# Patient Record
Sex: Female | Born: 1950 | Race: White | Hispanic: No | Marital: Single
Health system: Southern US, Community
[De-identification: ages and names within clinical notes are randomized; demographics above are authoritative.]

---

## 2017-12-14 ENCOUNTER — Other Ambulatory Visit: Payer: Self-pay

## 2017-12-14 ENCOUNTER — Emergency Department (HOSPITAL_COMMUNITY): Payer: PRIVATE HEALTH INSURANCE

## 2017-12-14 ENCOUNTER — Encounter (HOSPITAL_COMMUNITY): Payer: Self-pay

## 2017-12-14 ENCOUNTER — Emergency Department (HOSPITAL_COMMUNITY)
Admission: EM | Admit: 2017-12-14 | Discharge: 2017-12-14 | Disposition: A | Discharge status: Home / Self Care | Payer: PRIVATE HEALTH INSURANCE | Attending: Emergency Medicine | Admitting: Emergency Medicine

## 2017-12-14 DIAGNOSIS — N201 Calculus of ureter: Secondary | ICD-10-CM

## 2017-12-14 DIAGNOSIS — R1031 Right lower quadrant pain: Secondary | ICD-10-CM | POA: Diagnosis present

## 2017-12-14 DIAGNOSIS — N130 Hydronephrosis with ureteropelvic junction obstruction: Secondary | ICD-10-CM | POA: Diagnosis not present

## 2017-12-14 DIAGNOSIS — K862 Cyst of pancreas: Secondary | ICD-10-CM

## 2017-12-14 DIAGNOSIS — N2889 Other specified disorders of kidney and ureter: Secondary | ICD-10-CM

## 2017-12-14 DIAGNOSIS — N132 Hydronephrosis with renal and ureteral calculous obstruction: Secondary | ICD-10-CM | POA: Diagnosis not present

## 2017-12-14 DIAGNOSIS — N202 Calculus of kidney with calculus of ureter: Secondary | ICD-10-CM | POA: Diagnosis not present

## 2017-12-14 DIAGNOSIS — N1330 Unspecified hydronephrosis: Secondary | ICD-10-CM

## 2017-12-14 LAB — URINALYSIS, ROUTINE W REFLEX MICROSCOPIC
Bilirubin Urine: NEGATIVE
Glucose, UA: NEGATIVE mg/dL
Ketones, ur: NEGATIVE mg/dL
Leukocytes, UA: NEGATIVE
Nitrite: NEGATIVE
PH: 8 (ref 5.0–8.0)
Protein, ur: NEGATIVE mg/dL
RBC / HPF: >50 RBC/hpf — ABNORMAL HIGH (ref 0–5)
SPECIFIC GRAVITY, URINE: 1.015 (ref 1.005–1.030)

## 2017-12-14 MED ORDER — TAMSULOSIN HCL 0.4 MG PO CAPS: ORAL_CAPSULE | ORAL | 0 refills | Status: AC

## 2017-12-14 MED ORDER — HYDROCODONE-ACETAMINOPHEN 5-325 MG PO TABS: 1.0000 | ORAL_TABLET | ORAL | 0 refills | Status: AC | PRN

## 2017-12-14 MED ORDER — HYDROCODONE-ACETAMINOPHEN 5-325 MG PO TABS
1.0000 | ORAL_TABLET | Freq: Once | ORAL | Status: AC
  Administered 2017-12-14: 1 via ORAL
  Filled 2017-12-14: qty 1

## 2017-12-14 MED ORDER — ONDANSETRON HCL 4 MG/2ML IJ SOLN
4.0000 mg | Freq: Once | INTRAMUSCULAR | Status: AC
  Administered 2017-12-14: 4 mg via INTRAVENOUS
  Filled 2017-12-14: qty 2

## 2017-12-14 NOTE — ED Notes (Signed)
Doctor at bedside.

## 2017-12-14 NOTE — ED Provider Notes (Signed)
[facility] EMERGENCY DEPARTMENT Provider Note   CSN: [id number] Arrival date & time: 12/14/17  1031     History   Chief Complaint Chief Complaint  Patient presents with  . Flank Pain    HPI Destiny Crawford is a 67 y.o. female.  HPI   Planes of right flank pain starting 3 hours ago, consistent with prior episodes of kidney stone.  No recent kidney stone.  She urinated this morning did not have dysuria, urinary frequency or hematuria.  She denies fever, chills, shortness of breath or chest pain.  She has nausea today.  There are no other no modifying factors.  History reviewed. No pertinent past medical history.  There are no active problems to display for this patient.   History reviewed. No pertinent surgical history.   OB History   None      Home Medications    Prior to Admission medications   Medication Sig Start Date End Date Taking? Authorizing Provider  HYDROcodone-acetaminophen (NORCO) 5-325 MG tablet Take 1 tablet by mouth every 4 (four) hours as needed for moderate pain. 12/14/17   Mancel Bale, MD  tamsulosin [facility]) 0.4 MG CAPS capsule 1 q HS to aid stone passage 12/14/17   Mancel Bale, MD    Family History No family history on file.  Social History Social History   Tobacco Use  . Smoking status: Not on file  Substance Use Topics  . Alcohol use: Not Currently  . Drug use: Never     Allergies   Patient has no allergy information on record.   Review of Systems Review of Systems  All other systems reviewed and are negative.    Physical Exam Updated Vital Signs BP (!) 146/73 (BP Location: Right Arm)   Pulse (!) 53   Temp 97.6 F (36.4 C) (Oral)   Resp 15   Ht 5\' 9"  (1.753 m)   Wt 72.6 kg   SpO2 100%   BMI 23.63 kg/m   Physical Exam  Constitutional: She is oriented to person, place, and time. She appears well-developed and well-nourished.  HENT:  Head: Normocephalic and atraumatic.  Eyes: Pupils are  equal, round, and reactive to light. Conjunctivae and EOM are normal.  Neck: Normal range of motion and phonation normal. Neck supple.  Cardiovascular: Normal rate and regular rhythm.  Pulmonary/Chest: Effort normal and breath sounds normal. She exhibits no tenderness.  Genitourinary:  Genitourinary Comments: No costovertebral angle tenderness with percussion.  Musculoskeletal: Normal range of motion.  Neurological: She is alert and oriented to person, place, and time. She exhibits normal muscle tone.  Skin: Skin is warm and dry.  Psychiatric: She has a normal mood and affect. Her behavior is normal. Judgment and thought content normal.  Nursing note and vitals reviewed.    ED Treatments / Results  Labs (all labs ordered are listed, but only abnormal results are displayed) Labs Reviewed  URINALYSIS, ROUTINE W REFLEX MICROSCOPIC - Abnormal; Notable for the following components:      Result Value   APPearance CLOUDY (*)    Hgb urine dipstick MODERATE (*)    RBC / HPF >50 (*)    Bacteria, UA RARE (*)    All other components within normal limits    EKG None  Radiology Ct Renal Stone Study  Result Date: 12/14/2017 CLINICAL DATA:  Right flank pain today EXAM: CT ABDOMEN AND PELVIS WITHOUT CONTRAST TECHNIQUE: Multidetector CT imaging of the abdomen and pelvis was performed following the standard protocol  without IV contrast. COMPARISON:  None. FINDINGS: Lower chest: No acute abnormality. Hepatobiliary: There are multiple cysts in the liver. Unremarkable gallbladder. Pancreas: There is a 1.5 cm oval low-density lesion in the head of the pancreas on image 29 of series 3. It is incompletely evaluated on this study. A cyst is favored, but a cystic neoplasm cannot be excluded. Spleen: Unremarkable Adrenals/Urinary Tract: Moderate right hydronephrosis. 2 mm calculus at the right ureteropelvic junction. Tiny calculi are scattered throughout the collecting system of the right kidney. No evidence  of left renal calculi. There is a 9 mm isodense lesion in the interpolar region of the left kidney on image 30. Right perinephric stranding is noted. Bladder is decompressed. Stomach/Bowel: No obvious mass in the colon. Appendix is within normal limits. No evidence of small-bowel obstruction. Stomach is decompressed. Small hiatal hernia is suspected. Vascular/Lymphatic: No abnormal retroperitoneal adenopathy. Multiple calcified phleboliths in the pelvis. No evidence of aortic aneurysm. Reproductive: Uterus and adnexa are within normal limits. Other: No free fluid. Musculoskeletal: No vertebral compression deformity. IMPRESSION: Moderate right hydronephrosis is associated with a 2 mm right ureteropelvic junction calculus. Right nephrolithiasis. Benign-appearing liver cysts. 1.5 cm cystic lesion in the head of the pancreas is nonspecific. Pancreas MRI is recommended. 9 mm indeterminate lesion in the left kidney as described. MRI can be performed to rule out enhancing mass. Electronically Signed   By: Jolaine ClickArthur  Hoss M.D.   On: 12/14/2017 11:14    Procedures Procedures (including critical care time)  Medications Ordered in ED Medications  HYDROcodone-acetaminophen (NORCO/VICODIN) 5-325 MG per tablet 1 tablet (1 tablet Oral Given 12/14/17 1051)  ondansetron (ZOFRAN) injection 4 mg (4 mg Intravenous Given 12/14/17 1051)     Initial Impression / Assessment and Plan / ED Course  I have reviewed the triage vital signs and the nursing notes.  Pertinent labs & imaging results that were available during my care of the patient were reviewed by me and considered in my medical decision making (see chart for details).  Clinical Course as of Dec 14 1225  Sat Dec 14, 2017  1206 Normal except increased blood and RBCs.  Rare bacteria bacteria are present.  Urinalysis, Routine w reflex microscopic(!) [EW]  1207 Normal except; obstructing proximal right ureter stone at UPJ.  Mild hydronephrosis is present.  Incidental  findings left renal and pancreatic cysts.  Images reviewed by me.  Patient was informed of these findings, by me.  CT Renal Soundra PilonStone Study [EW]    Clinical Course User Index [EW] Mancel BaleWentz, Patte Winkel, MD     Patient Vitals for the past 24 hrs:  BP Temp Temp src Pulse Resp SpO2 Height Weight  12/14/17 1105 (!) 146/73 97.6 F (36.4 C) Oral (!) 53 15 100 % - -  12/14/17 1038 - - - - - - 5\' 9"  (1.753 m) 72.6 kg    12:26 PM Reevaluation with update and discussion. After initial assessment and treatment, an updated evaluation reveals she is comfortable has no pain at this time.  Findings discussed and questions answered. Mancel BaleElliott Steffon Gladu   Medical Decision Making: Right flank pain secondary to obstructing kidney stone.  No evidence for UTI.  Pain controlled in the ED.  Incidental abnormalities pancreas and left kidney, small masses, requiring follow-up.  Patient informed.  Doubt serious bacterial infection, metabolic instability or impending vascular collapse.  CRITICAL CARE-no Performed by: Mancel BaleElliott Prestyn Stanco  Nursing Notes Reviewed/ Care Coordinated Applicable Imaging Reviewed Interpretation of Laboratory Data incorporated into ED treatment  The patient appears  reasonably screened and/or stabilized for discharge and I doubt any other medical condition or other [facility] requiring further screening, evaluation, or treatment in the ED at this time prior to discharge.  Plan: Home Medications-continue routine medications; Home Treatments-strain urine; return here if the recommended treatment, does not improve the symptoms; Recommended follow up-urology 1 week for follow-up.  PCP regarding pancreatic and left renal abnormalities follow-up in the next few weeks for MRI testing.    Final Clinical Impressions(s) / ED Diagnoses   Final diagnoses:  Obstruction of right ureteropelvic junction (UPJ) due to stone  Hydronephrosis, right  Pancreatic cyst  Renal mass    ED Discharge Orders         Ordered     HYDROcodone-acetaminophen (NORCO) 5-325 MG tablet  Every 4 hours PRN     12/14/17 1225    tamsulosin (FLOMAX) 0.4 MG CAPS capsule     12/14/17 1225           Mancel Bale, MD 12/14/17 1228

## 2017-12-14 NOTE — ED Triage Notes (Signed)
Pt c/o right sided flank pain that started 3 hours ago. Pt endorses n/v.

## 2017-12-14 NOTE — Discharge Instructions (Signed)
Your pain today is secondary to a small stone in the proximal right ureter causing some kidney swelling.  This can be treated with narcotic pain medicine and Flomax to help relax the tube and allow the stone to drop out.  This may take 2 or 3 days.  If your pain cannot be controlled[facility]  The CT also showed 2 cysts, one in the pancreas, and one in the left kidney.  We recommend that you get an MRI in the next few weeks, to evaluate both of these abnormalities.  Your primary care doctor should be able to do that for you.

## 2020-01-29 ENCOUNTER — Other Ambulatory Visit: Payer: Self-pay | Admitting: Family Medicine

## 2020-01-29 ENCOUNTER — Ambulatory Visit
Admission: RE | Admit: 2020-01-29 | Discharge: 2020-01-29 | Disposition: A | Discharge status: Home / Self Care | Payer: Medicare Other | Source: Ambulatory Visit | Attending: Family Medicine | Admitting: Family Medicine

## 2020-01-29 ENCOUNTER — Other Ambulatory Visit: Payer: Self-pay

## 2020-01-29 DIAGNOSIS — M130 Polyarthritis, unspecified: Secondary | ICD-10-CM

## 2021-10-21 IMAGING — CR DG LUMBAR SPINE COMPLETE 4+V
5 series · 5 of 5 positions shown · non-contrast
Comparison: None.

CLINICAL DATA: Hip pain

EXAM:
LUMBAR SPINE - COMPLETE 4+ VIEW

[t lumbar spine ap]
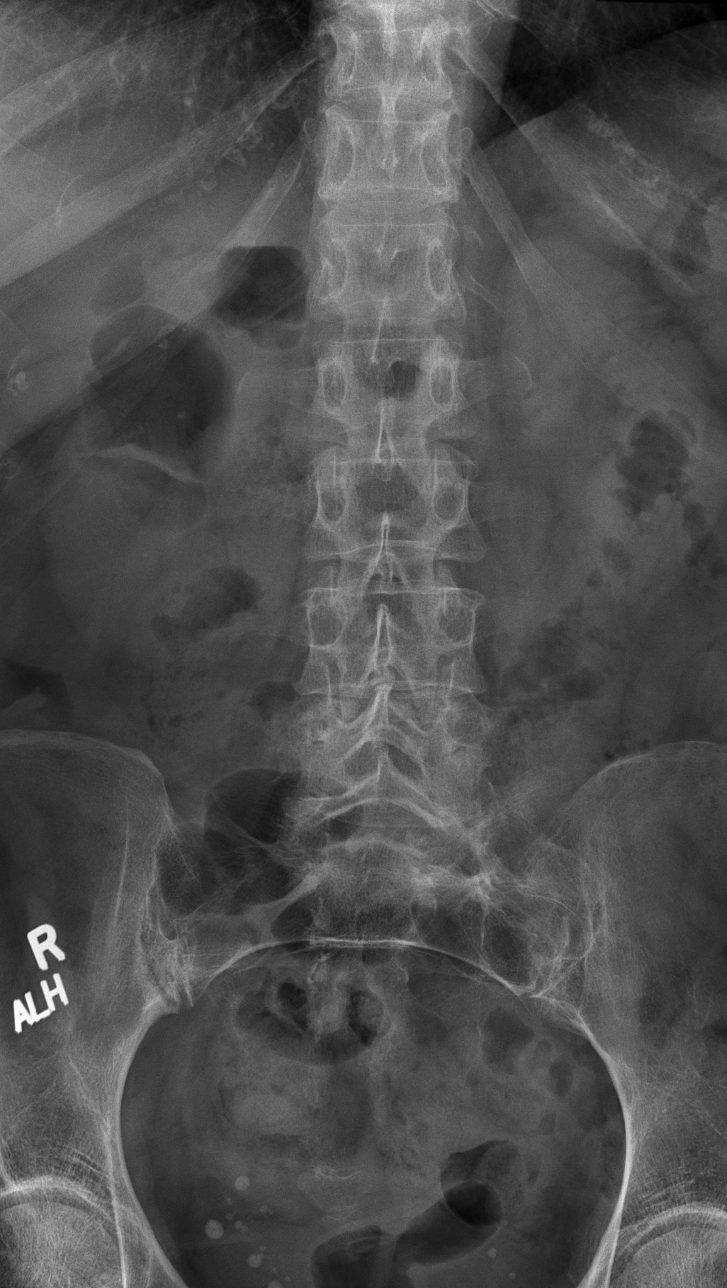

[t lumbar spine obl (1 of 2)]
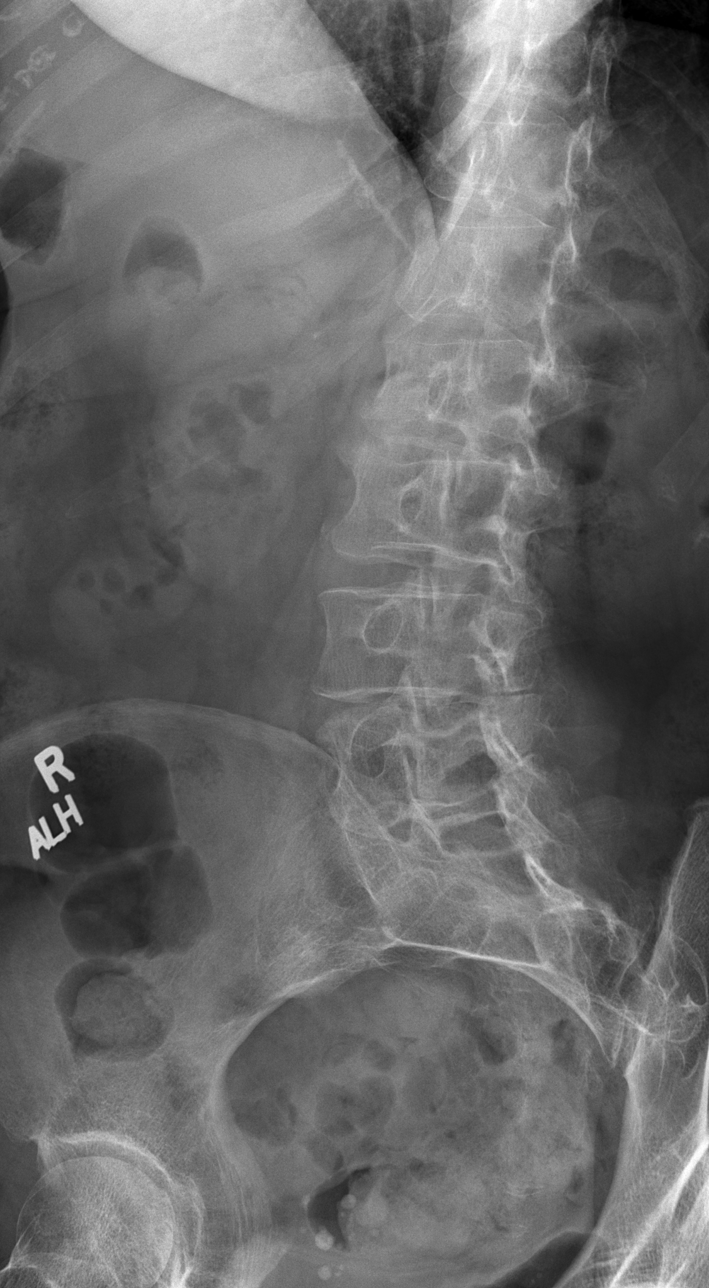

[t lumbar spine obl (2 of 2)]
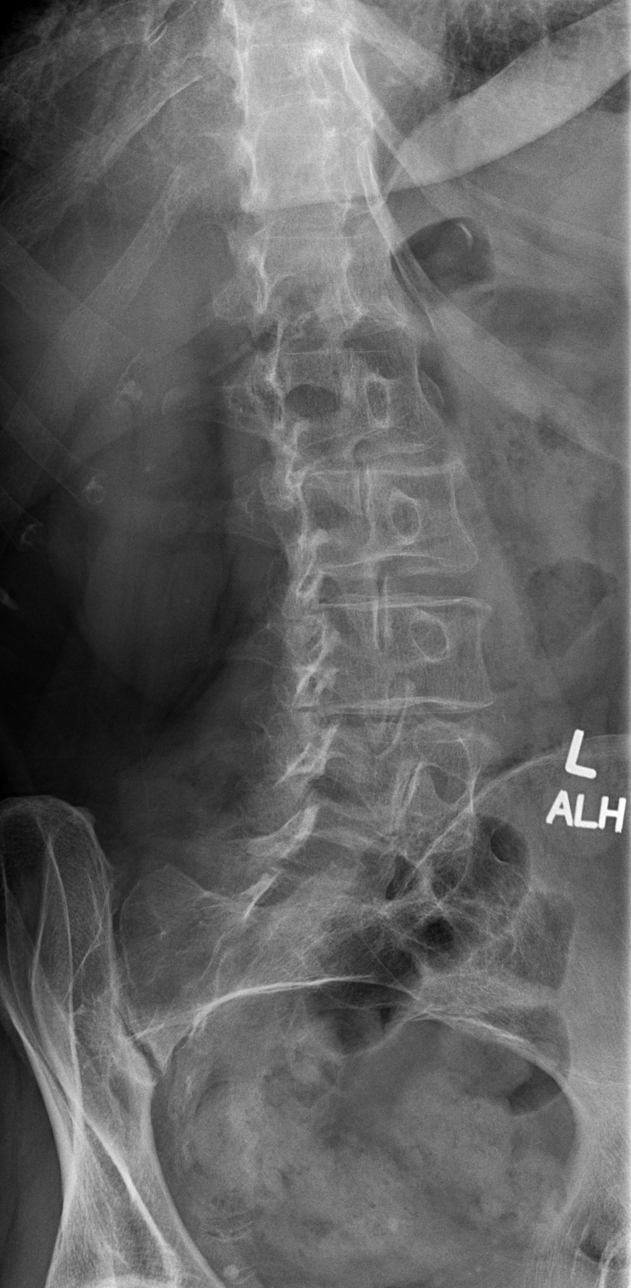

[t lumbar spine lat]
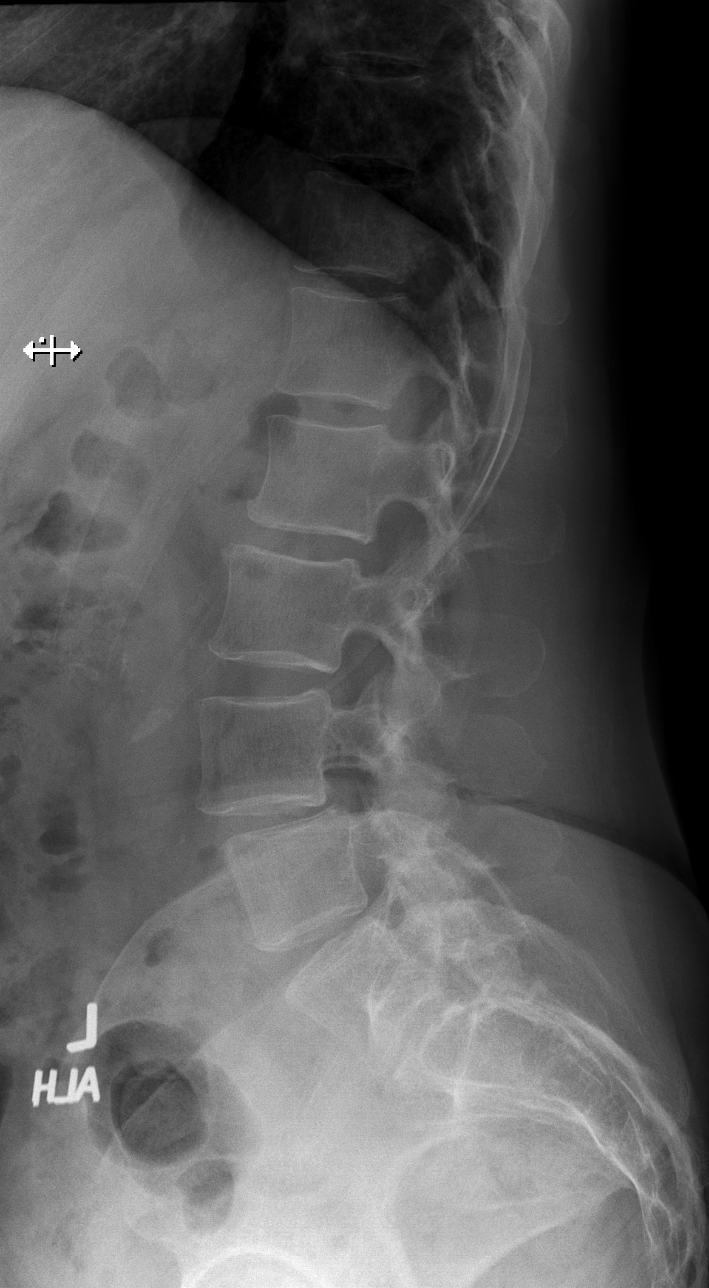

[t lumbar l-5 s-1 spot]
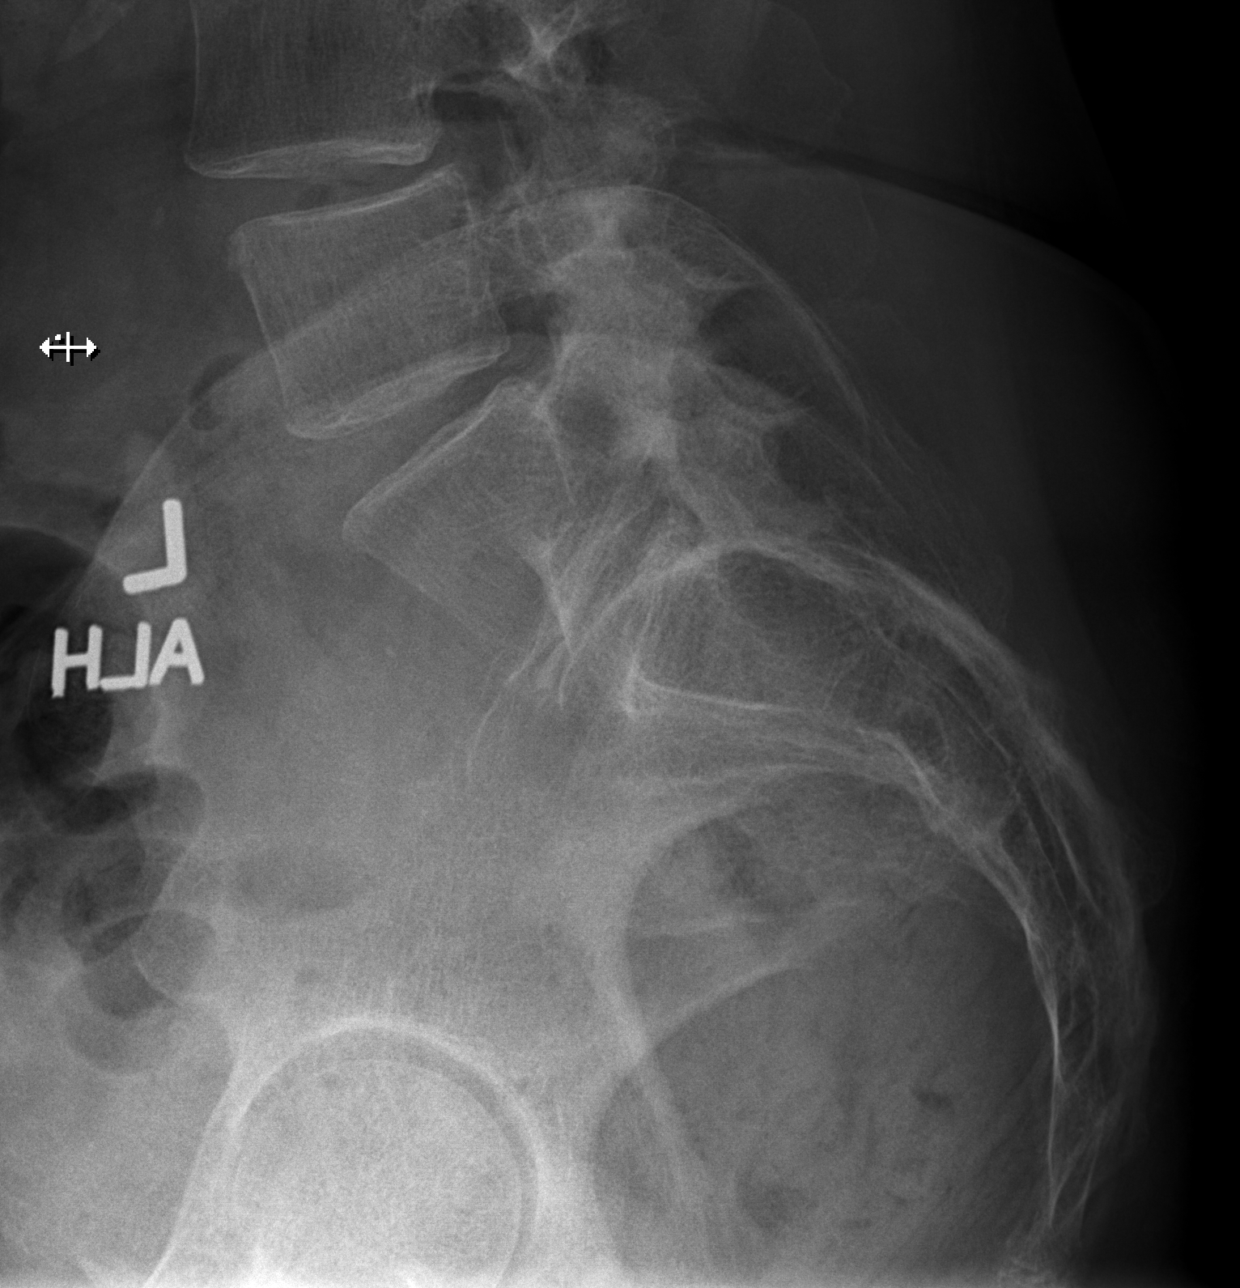

[5 of 5 positions shown; findings below may reference images not displayed]

FINDINGS: Grade 1 anterolisthesis at L3-4 and grade 1 retrolisthesis at L2-3.
Mild lower lumbar facet hypertrophy. Vertebral body heights are
normal. Slight disc space narrowing at L4-5.
IMPRESSION: Grade 1 L2-3 retrolisthesis and L3-4 anterolisthesis. Mild L4-5
degenerative disc disease.
